# Patient Record
Sex: Female | Born: 1970 | Race: White | Hispanic: No | Smoking: Current every day smoker
Health system: Southern US, Community
[De-identification: ages and names within clinical notes are randomized; demographics above are authoritative.]

## PROBLEM LIST (undated history)

## (undated) DIAGNOSIS — M1990 Unspecified osteoarthritis, unspecified site: Secondary | ICD-10-CM

## (undated) DIAGNOSIS — M797 Fibromyalgia: Secondary | ICD-10-CM

## (undated) DIAGNOSIS — F419 Anxiety disorder, unspecified: Secondary | ICD-10-CM

## (undated) DIAGNOSIS — F32A Depression, unspecified: Secondary | ICD-10-CM

## (undated) DIAGNOSIS — R87629 Unspecified abnormal cytological findings in specimens from vagina: Secondary | ICD-10-CM

## (undated) DIAGNOSIS — J45909 Unspecified asthma, uncomplicated: Secondary | ICD-10-CM

## (undated) DIAGNOSIS — F329 Major depressive disorder, single episode, unspecified: Secondary | ICD-10-CM

## (undated) DIAGNOSIS — K219 Gastro-esophageal reflux disease without esophagitis: Secondary | ICD-10-CM

## (undated) DIAGNOSIS — Z972 Presence of dental prosthetic device (complete) (partial): Secondary | ICD-10-CM

## (undated) HISTORY — DX: Unspecified abnormal cytological findings in specimens from vagina: R87.629

## (undated) HISTORY — PX: CERVICAL CONE BIOPSY: SUR198

## (undated) HISTORY — PX: MOUTH SURGERY: SHX715

## (undated) HISTORY — PX: TUBAL LIGATION: SHX77

## (undated) HISTORY — PX: WISDOM TOOTH EXTRACTION: SHX21

---

## 2017-06-14 ENCOUNTER — Ambulatory Visit (INDEPENDENT_AMBULATORY_CARE_PROVIDER_SITE_OTHER): Payer: Medicare Other | Admitting: Obstetrics & Gynecology

## 2017-06-14 ENCOUNTER — Encounter: Payer: Self-pay | Admitting: Obstetrics & Gynecology

## 2017-06-14 ENCOUNTER — Other Ambulatory Visit (HOSPITAL_COMMUNITY)
Admission: RE | Admit: 2017-06-14 | Discharge: 2017-06-14 | Disposition: A | Discharge status: Home / Self Care | Payer: Medicare Other | Source: Ambulatory Visit | Attending: Obstetrics & Gynecology | Admitting: Obstetrics & Gynecology

## 2017-06-14 VITALS — BP 120/80 | HR 74 | Ht 66.0 in | Wt 230.0 lb

## 2017-06-14 DIAGNOSIS — Z0000 Encounter for general adult medical examination without abnormal findings: Secondary | ICD-10-CM | POA: Diagnosis present

## 2017-06-14 DIAGNOSIS — N941 Unspecified dyspareunia: Secondary | ICD-10-CM | POA: Diagnosis not present

## 2017-06-14 DIAGNOSIS — N921 Excessive and frequent menstruation with irregular cycle: Secondary | ICD-10-CM

## 2017-06-14 DIAGNOSIS — R102 Pelvic and perineal pain: Secondary | ICD-10-CM | POA: Diagnosis not present

## 2017-06-14 MED ORDER — MISOPROSTOL 200 MCG PO TABS: ORAL_TABLET | ORAL | 0 refills | Status: DC

## 2017-06-14 NOTE — Progress Notes (Signed)
   Subjective:    Patient ID: Megan Patel, female    DOB: 01-15-1971, 46 y.o.   MRN: [id number]  HPI    Review of Systems     Objective:   Physical Exam        Assessment & Plan:  Preventative care- pap with cotesting Menorrhagia- schedule gyn u/s, TSH, CBC Pretreat with cytotec before EMBX in 4 weeks

## 2017-06-14 NOTE — Progress Notes (Signed)
   Subjective:    Patient ID: Megan Patel, female    DOB: 14-Apr-1971, 46 y.o.   MRN: [id number]  HPI 46 yo DW P3 (44 to 44 yo kids, no grands yet) here today from [place], [place] for evaluation of "excessive bleeding". Periods normally monthy for 7-9 days but over the last 3 months she has had 2 periods per month. She also has pain in her pelvis. She also reports GSUI since 2004. She reports dyspareunia for "many years".  Review of Systems BTL for contraception in 2005. She has seen me in [facility] and I did a laparoscopy then. She has has 2 LEEPs at Dr. Luvenia Heller in [place], [place] Uses [facility] for her pain H/O narcotic use for her pain    Objective:   Physical Exam  Pleasant obese white female Breathing, conversing, and ambulating normally Abd- benign Cervix appears normal Bimanual exam [place] due to body habitus      Assessment & Plan:  Preventative care- pap smear DUB- gyn u/s, CBC, TSH Plan EMBX, pretreat with cytotec

## 2017-06-16 LAB — CYTOLOGY - PAP: HPV (WINDOPATH): DETECTED — AB

## 2017-06-17 LAB — CBC
HEMOGLOBIN: 14.1 g/dL (ref 11.1–15.9)
Hematocrit: 43.1 % (ref 34.0–46.6)
MCH: 27.9 pg (ref 26.6–33.0)
MCHC: 32.7 g/dL (ref 31.5–35.7)
MCV: 85 fL (ref 79–97)
PLATELETS: 336 10*3/uL (ref 150–379)
RBC: 5.06 x10E6/uL (ref 3.77–5.28)
RDW: 14.7 % (ref 12.3–15.4)
WBC: 8.7 10*3/uL (ref 3.4–10.8)

## 2017-06-17 LAB — TSH: TSH: 4.57 u[IU]/mL — AB (ref 0.450–4.500)

## 2017-06-17 LAB — VITAMIN D 1,25 DIHYDROXY
VITAMIN D3 1, 25 (OH): 37 pg/mL
Vitamin D 1, 25 (OH)2 Total: 38 pg/mL
Vitamin D2 1, 25 (OH)2: <10 pg/mL

## 2017-06-21 ENCOUNTER — Ambulatory Visit (HOSPITAL_COMMUNITY): Admission: RE | Admit: 2017-06-21 | Payer: Medicare Other | Source: Ambulatory Visit

## 2017-07-14 ENCOUNTER — Ambulatory Visit (INDEPENDENT_AMBULATORY_CARE_PROVIDER_SITE_OTHER): Payer: Medicare Other | Admitting: Obstetrics & Gynecology

## 2017-07-14 ENCOUNTER — Encounter: Payer: Self-pay | Admitting: Obstetrics & Gynecology

## 2017-07-14 ENCOUNTER — Telehealth: Payer: Self-pay

## 2017-07-14 ENCOUNTER — Encounter (HOSPITAL_COMMUNITY): Payer: Self-pay | Admitting: Emergency Medicine

## 2017-07-14 ENCOUNTER — Ambulatory Visit (HOSPITAL_COMMUNITY)
Admission: RE | Admit: 2017-07-14 | Discharge: 2017-07-14 | Disposition: A | Discharge status: Home / Self Care | Payer: Medicare Other | Source: Ambulatory Visit | Attending: Obstetrics & Gynecology | Admitting: Obstetrics & Gynecology

## 2017-07-14 ENCOUNTER — Other Ambulatory Visit (HOSPITAL_COMMUNITY)
Admission: RE | Admit: 2017-07-14 | Discharge: 2017-07-14 | Disposition: A | Discharge status: Home / Self Care | Payer: Medicare Other | Source: Ambulatory Visit | Attending: Obstetrics & Gynecology | Admitting: Obstetrics & Gynecology

## 2017-07-14 ENCOUNTER — Ambulatory Visit (HOSPITAL_COMMUNITY)
Admission: EM | Admit: 2017-07-14 | Discharge: 2017-07-14 | Disposition: A | Discharge status: Home / Self Care | Payer: Medicare Other | Attending: Family Medicine | Admitting: Family Medicine

## 2017-07-14 VITALS — BP 137/91 | HR 72 | Wt 226.1 lb

## 2017-07-14 DIAGNOSIS — Z3202 Encounter for pregnancy test, result negative: Secondary | ICD-10-CM

## 2017-07-14 DIAGNOSIS — S39012A Strain of muscle, fascia and tendon of lower back, initial encounter: Secondary | ICD-10-CM

## 2017-07-14 DIAGNOSIS — N870 Mild cervical dysplasia: Secondary | ICD-10-CM | POA: Insufficient documentation

## 2017-07-14 DIAGNOSIS — S8000XA Contusion of unspecified knee, initial encounter: Secondary | ICD-10-CM

## 2017-07-14 DIAGNOSIS — S46811A Strain of other muscles, fascia and tendons at shoulder and upper arm level, right arm, initial encounter: Secondary | ICD-10-CM

## 2017-07-14 DIAGNOSIS — N921 Excessive and frequent menstruation with irregular cycle: Secondary | ICD-10-CM

## 2017-07-14 DIAGNOSIS — R87612 Low grade squamous intraepithelial lesion on cytologic smear of cervix (LGSIL): Secondary | ICD-10-CM

## 2017-07-14 DIAGNOSIS — S29019A Strain of muscle and tendon of unspecified wall of thorax, initial encounter: Secondary | ICD-10-CM

## 2017-07-14 DIAGNOSIS — R946 Abnormal results of thyroid function studies: Secondary | ICD-10-CM

## 2017-07-14 DIAGNOSIS — R32 Unspecified urinary incontinence: Secondary | ICD-10-CM

## 2017-07-14 LAB — POCT PREGNANCY, URINE: PREG TEST UR: NEGATIVE

## 2017-07-14 MED ORDER — METHOCARBAMOL 500 MG PO TABS: 500.0000 mg | ORAL_TABLET | Freq: Two times a day (BID) | ORAL | 0 refills | Status: DC
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
## 2017-07-14 NOTE — Discharge Instructions (Signed)
Apply ice to the front of the knees for the next couple days. May apply heat to the muscles that are sore in your neck and back. So we perform stretches as demonstrated. Take the muscle relaxant has requested and directed. This will take a few days before the aches and pains to go away.

## 2017-07-14 NOTE — Telephone Encounter (Signed)
Pt needs to be contacted about Referral to [facility] @ [phone] scheduled for November 9th @ 1300.  Attempted to contact unable to LM due to phone keeps ringing.  Pt is aware to expect call with appt.

## 2017-07-14 NOTE — Addendum Note (Signed)
Addended by: Faythe Casa on: 07/14/2017 05:41 PM   Modules accepted: Orders
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
## 2017-07-15 LAB — T4, FREE: FREE T4: 1.05 ng/dL (ref 0.82–1.77)

## 2017-07-15 LAB — T3, FREE: T3, Free: 3.7 pg/mL (ref 2.0–4.4)

## 2017-07-21 ENCOUNTER — Encounter: Payer: Self-pay | Admitting: Obstetrics & Gynecology

## 2017-07-26 ENCOUNTER — Encounter: Payer: Self-pay | Admitting: *Deleted

## 2017-07-26 NOTE — Telephone Encounter (Signed)
Tried calling patient again. Still no answer, phone keeps ringing. Will send letter with appointment information.

## 2017-08-04 ENCOUNTER — Encounter (HOSPITAL_COMMUNITY): Payer: Self-pay

## 2017-08-04 ENCOUNTER — Encounter: Payer: Self-pay | Admitting: Obstetrics & Gynecology

## 2017-08-04 ENCOUNTER — Encounter (HOSPITAL_COMMUNITY): Payer: Self-pay | Admitting: *Deleted

## 2017-08-04 ENCOUNTER — Ambulatory Visit (INDEPENDENT_AMBULATORY_CARE_PROVIDER_SITE_OTHER): Payer: Medicare Other | Admitting: Obstetrics & Gynecology

## 2017-08-04 VITALS — BP 129/83 | HR 72 | Ht 66.0 in | Wt 230.0 lb

## 2017-08-04 DIAGNOSIS — N870 Mild cervical dysplasia: Secondary | ICD-10-CM

## 2017-08-04 DIAGNOSIS — N938 Other specified abnormal uterine and vaginal bleeding: Secondary | ICD-10-CM | POA: Diagnosis present

## 2017-08-04 MED ORDER — METRONIDAZOLE 500 MG PO TABS: ORAL_TABLET | ORAL | 0 refills | Status: DC

## 2017-08-04 NOTE — Progress Notes (Signed)
   Subjective:    Patient ID: Megan Patel, female    DOB: 20-Jul-1971, 46 y.o.   MRN: [id number]  HPI 46 yo DW P3 here for her results. 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

## 2017-08-16 NOTE — Anesthesia Preprocedure Evaluation (Addendum)
Anesthesia Evaluation  Patient identified by MRN, date of birth, ID band Patient awake    Reviewed: Allergy & Precautions, NPO status , Patient's Chart, lab work & pertinent test results  Airway Mallampati: II  TM Distance: >3 FB Neck ROM: Full    Dental  (+) Edentulous Upper, Edentulous Lower   Pulmonary asthma , Current Smoker,    Pulmonary exam normal breath sounds clear to auscultation       Cardiovascular negative cardio ROS Normal cardiovascular exam Rhythm:Regular Rate:Normal     Neuro/Psych Anxiety Depression negative neurological ROS     GI/Hepatic GERD  Controlled,(+)     substance abuse  marijuana use,   Endo/Other  Obesity  Renal/GU negative Renal ROS  negative genitourinary   Musculoskeletal  (+) Arthritis , Fibromyalgia -  Abdominal   Peds  Hematology negative hematology ROS (+)   Anesthesia Other Findings   Reproductive/Obstetrics                            Anesthesia Physical Anesthesia Plan  ASA: II  Anesthesia Plan: General   Post-op Pain Management:    Induction: Intravenous  PONV Risk Score and Plan: 3 and Ondansetron, Dexamethasone, Midazolam and Treatment may vary due to age or medical condition  Airway Management Planned: LMA  Additional Equipment: None  Intra-op Plan:   Post-operative Plan: Extubation in OR  Informed Consent: I have reviewed the patients History and Physical, chart, labs and discussed the procedure including the risks, benefits and alternatives for the proposed anesthesia with the patient or authorized representative who has indicated his/her understanding and acceptance.   Dental advisory given  Plan Discussed with: CRNA  Anesthesia Plan Comments:         Anesthesia Quick Evaluation

## 2017-08-17 ENCOUNTER — Ambulatory Visit (HOSPITAL_COMMUNITY): Payer: Medicare Other | Admitting: Anesthesiology

## 2017-08-17 ENCOUNTER — Encounter (HOSPITAL_COMMUNITY): Payer: Self-pay | Admitting: Emergency Medicine

## 2017-08-17 ENCOUNTER — Ambulatory Visit (HOSPITAL_COMMUNITY)
Admission: AD | Admit: 2017-08-17 | Discharge: 2017-08-17 | Disposition: A | Discharge status: Home / Self Care | Payer: Medicare Other | Source: Ambulatory Visit | Attending: Obstetrics & Gynecology | Admitting: Obstetrics & Gynecology

## 2017-08-17 ENCOUNTER — Encounter (HOSPITAL_COMMUNITY)
Admission: AD | Disposition: A | Discharge status: Home / Self Care | Payer: Self-pay | Source: Ambulatory Visit | Attending: Obstetrics & Gynecology

## 2017-08-17 DIAGNOSIS — F17210 Nicotine dependence, cigarettes, uncomplicated: Secondary | ICD-10-CM | POA: Diagnosis not present

## 2017-08-17 DIAGNOSIS — F419 Anxiety disorder, unspecified: Secondary | ICD-10-CM | POA: Insufficient documentation

## 2017-08-17 DIAGNOSIS — M797 Fibromyalgia: Secondary | ICD-10-CM | POA: Insufficient documentation

## 2017-08-17 DIAGNOSIS — F329 Major depressive disorder, single episode, unspecified: Secondary | ICD-10-CM | POA: Diagnosis not present

## 2017-08-17 DIAGNOSIS — N870 Mild cervical dysplasia: Secondary | ICD-10-CM | POA: Diagnosis not present

## 2017-08-17 DIAGNOSIS — K219 Gastro-esophageal reflux disease without esophagitis: Secondary | ICD-10-CM | POA: Diagnosis not present

## 2017-08-17 DIAGNOSIS — N938 Other specified abnormal uterine and vaginal bleeding: Secondary | ICD-10-CM | POA: Diagnosis not present

## 2017-08-17 DIAGNOSIS — J45909 Unspecified asthma, uncomplicated: Secondary | ICD-10-CM | POA: Diagnosis not present

## 2017-08-17 DIAGNOSIS — Z880 Allergy status to penicillin: Secondary | ICD-10-CM | POA: Diagnosis not present

## 2017-08-17 DIAGNOSIS — Z79899 Other long term (current) drug therapy: Secondary | ICD-10-CM | POA: Insufficient documentation

## 2017-08-17 HISTORY — DX: Unspecified osteoarthritis, unspecified site: M19.90

## 2017-08-17 HISTORY — DX: Presence of dental prosthetic device (complete) (partial): Z97.2

## 2017-08-17 HISTORY — DX: Unspecified asthma, uncomplicated: J45.909

## 2017-08-17 HISTORY — DX: Depression, unspecified: F32.A

## 2017-08-17 HISTORY — DX: Anxiety disorder, unspecified: F41.9

## 2017-08-17 HISTORY — DX: Major depressive disorder, single episode, unspecified: F32.9

## 2017-08-17 HISTORY — DX: Gastro-esophageal reflux disease without esophagitis: K21.9

## 2017-08-17 HISTORY — PX: CERVICAL CONIZATION W/BX: SHX1330

## 2017-08-17 HISTORY — PX: DILITATION & CURRETTAGE/HYSTROSCOPY WITH HYDROTHERMAL ABLATION: SHX5570

## 2017-08-17 HISTORY — DX: Fibromyalgia: M79.7

## 2017-08-17 LAB — CBC
HEMATOCRIT: 42.7 % (ref 36.0–46.0)
HEMOGLOBIN: 14.2 g/dL (ref 12.0–15.0)
MCH: 28.3 pg (ref 26.0–34.0)
MCHC: 33.3 g/dL (ref 30.0–36.0)
MCV: 85.2 fL (ref 78.0–100.0)
Platelets: 321 10*3/uL (ref 150–400)
RBC: 5.01 MIL/uL (ref 3.87–5.11)
RDW: 14.5 % (ref 11.5–15.5)
WBC: 10.4 10*3/uL (ref 4.0–10.5)

## 2017-08-17 LAB — PREGNANCY, URINE: Preg Test, Ur: NEGATIVE

## 2017-08-17 SURGERY — CONE BIOPSY, CERVIX
Anesthesia: General | Site: Vagina

## 2017-08-17 MED ORDER — SODIUM CHLORIDE 0.9 % IR SOLN
Status: DC | PRN
  Administered 2017-08-17: 3000 mL

## 2017-08-17 MED ORDER — FENTANYL CITRATE (PF) 100 MCG/2ML IJ SOLN
INTRAMUSCULAR | Status: AC
  Administered 2017-08-17: 50 ug via INTRAVENOUS
  Filled 2017-08-17: qty 2

## 2017-08-17 MED ORDER — HYDROMORPHONE HCL 1 MG/ML IJ SOLN
0.2500 mg | INTRAMUSCULAR | Status: DC | PRN
Start: 2017-08-17 — End: 2017-08-17
  Administered 2017-08-17 (×2): 0.5 mg via INTRAVENOUS

## 2017-08-17 MED ORDER — FENTANYL CITRATE (PF) 100 MCG/2ML IJ SOLN
INTRAMUSCULAR | Status: AC
  Filled 2017-08-17: qty 2

## 2017-08-17 MED ORDER — METRONIDAZOLE 500 MG PO TABS: 500.0000 mg | ORAL_TABLET | Freq: Two times a day (BID) | ORAL | 0 refills | Status: AC

## 2017-08-17 MED ORDER — PROMETHAZINE HCL 25 MG/ML IJ SOLN: 6.2500 mg | INTRAMUSCULAR | Status: DC | PRN

## 2017-08-17 MED ORDER — IODINE STRONG (LUGOLS) 5 % PO SOLN
ORAL | Status: DC | PRN
  Administered 2017-08-17: 0.2 mL

## 2017-08-17 MED ORDER — PROPOFOL 10 MG/ML IV BOLUS
INTRAVENOUS | Status: DC | PRN
  Administered 2017-08-17: 180 mg via INTRAVENOUS

## 2017-08-17 MED ORDER — LIDOCAINE HCL (CARDIAC) 20 MG/ML IV SOLN
INTRAVENOUS | Status: AC
  Filled 2017-08-17: qty 5

## 2017-08-17 MED ORDER — KETOROLAC TROMETHAMINE 30 MG/ML IJ SOLN
INTRAMUSCULAR | Status: DC | PRN
Start: 2017-08-17 — End: 2017-08-17
  Administered 2017-08-17: 30 mg via INTRAVENOUS

## 2017-08-17 MED ORDER — DEXAMETHASONE SODIUM PHOSPHATE 10 MG/ML IJ SOLN
INTRAMUSCULAR | Status: DC | PRN
  Administered 2017-08-17: 4 mg via INTRAVENOUS

## 2017-08-17 MED ORDER — IODINE STRONG (LUGOLS) 5 % PO SOLN
ORAL | Status: AC
  Filled 2017-08-17: qty 1

## 2017-08-17 MED ORDER — BUPIVACAINE HCL (PF) 0.5 % IJ SOLN
INTRAMUSCULAR | Status: AC
  Filled 2017-08-17: qty 30

## 2017-08-17 MED ORDER — ONDANSETRON HCL 4 MG/2ML IJ SOLN
INTRAMUSCULAR | Status: DC | PRN
  Administered 2017-08-17: 4 mg via INTRAVENOUS

## 2017-08-17 MED ORDER — ACETIC ACID 5 % SOLN
Status: AC
  Filled 2017-08-17: qty 500

## 2017-08-17 MED ORDER — MIDAZOLAM HCL 2 MG/2ML IJ SOLN
INTRAMUSCULAR | Status: DC | PRN
  Administered 2017-08-17: 1 mg via INTRAVENOUS

## 2017-08-17 MED ORDER — FENTANYL CITRATE (PF) 100 MCG/2ML IJ SOLN
INTRAMUSCULAR | Status: DC | PRN
  Administered 2017-08-17 (×2): 50 ug via INTRAVENOUS

## 2017-08-17 MED ORDER — LACTATED RINGERS IV SOLN
INTRAVENOUS | Status: DC
  Administered 2017-08-17 (×2): via INTRAVENOUS

## 2017-08-17 MED ORDER — FERRIC SUBSULFATE 259 MG/GM EX SOLN
CUTANEOUS | Status: AC
  Filled 2017-08-17: qty 8

## 2017-08-17 MED ORDER — DEXAMETHASONE SODIUM PHOSPHATE 4 MG/ML IJ SOLN
INTRAMUSCULAR | Status: AC
  Filled 2017-08-17: qty 1

## 2017-08-17 MED ORDER — SCOPOLAMINE 1 MG/3DAYS TD PT72
MEDICATED_PATCH | TRANSDERMAL | Status: AC
  Administered 2017-08-17: 1.5 mg via TRANSDERMAL
  Filled 2017-08-17: qty 1

## 2017-08-17 MED ORDER — SCOPOLAMINE 1 MG/3DAYS TD PT72
1.0000 | MEDICATED_PATCH | Freq: Once | TRANSDERMAL | Status: DC
  Administered 2017-08-17: 1.5 mg via TRANSDERMAL

## 2017-08-17 MED ORDER — OXYCODONE-ACETAMINOPHEN 5-325 MG PO TABS
1.0000 | ORAL_TABLET | ORAL | Status: DC | PRN
  Administered 2017-08-17: 1 via ORAL

## 2017-08-17 MED ORDER — FENTANYL CITRATE (PF) 100 MCG/2ML IJ SOLN
25.0000 ug | INTRAMUSCULAR | Status: DC | PRN
  Administered 2017-08-17 (×3): 50 ug via INTRAVENOUS

## 2017-08-17 MED ORDER — ONDANSETRON HCL 4 MG/2ML IJ SOLN
INTRAMUSCULAR | Status: AC
  Filled 2017-08-17: qty 2

## 2017-08-17 MED ORDER — OXYCODONE-ACETAMINOPHEN 5-325 MG PO TABS
ORAL_TABLET | ORAL | Status: AC
  Filled 2017-08-17: qty 1

## 2017-08-17 MED ORDER — BUPIVACAINE HCL 0.5 % IJ SOLN
INTRAMUSCULAR | Status: DC | PRN
  Administered 2017-08-17: 30 mL

## 2017-08-17 MED ORDER — HYDROMORPHONE HCL 1 MG/ML IJ SOLN
INTRAMUSCULAR | Status: AC
  Filled 2017-08-17: qty 0.5

## 2017-08-17 MED ORDER — PROPOFOL 10 MG/ML IV BOLUS
INTRAVENOUS | Status: AC
  Filled 2017-08-17: qty 20

## 2017-08-17 MED ORDER — MIDAZOLAM HCL 2 MG/2ML IJ SOLN
INTRAMUSCULAR | Status: AC
  Filled 2017-08-17: qty 2

## 2017-08-17 MED ORDER — HYDROMORPHONE HCL 1 MG/ML IJ SOLN
INTRAMUSCULAR | Status: AC
  Administered 2017-08-17: 0.5 mg via INTRAVENOUS
  Filled 2017-08-17: qty 0.5

## 2017-08-17 MED ORDER — OXYCODONE-ACETAMINOPHEN 5-325 MG PO TABS: 1.0000 | ORAL_TABLET | Freq: Four times a day (QID) | ORAL | 0 refills | Status: AC | PRN

## 2017-08-17 MED ORDER — LIDOCAINE HCL (CARDIAC) 20 MG/ML IV SOLN
INTRAVENOUS | Status: DC | PRN
  Administered 2017-08-17: 30 mg via INTRAVENOUS
  Administered 2017-08-17: 70 mg via INTRAVENOUS

## 2017-08-17 SURGICAL SUPPLY — 29 items
BLADE SURG 11 STRL SS (BLADE) ×5 IMPLANT
CANISTER SUCT 3000ML PPV (MISCELLANEOUS) ×5 IMPLANT
CLOTH BEACON ORANGE TIMEOUT ST (SAFETY) IMPLANT
CONTAINER PREFILL 10% NBF 60ML (FORM) ×10 IMPLANT
DILATOR CANAL MILEX (MISCELLANEOUS) IMPLANT
ELECT REM PT RETURN 9FT ADLT (ELECTROSURGICAL) ×5
ELECTRODE REM PT RTRN 9FT ADLT (ELECTROSURGICAL) ×3 IMPLANT
GLOVE BIO SURGEON STRL SZ 6.5 (GLOVE) IMPLANT
GLOVE BIO SURGEONS STRL SZ 6.5 (GLOVE)
GLOVE BIOGEL PI IND STRL 6 (GLOVE) ×3 IMPLANT
GLOVE BIOGEL PI IND STRL 7.0 (GLOVE) ×3 IMPLANT
GLOVE BIOGEL PI INDICATOR 6 (GLOVE) ×2
GLOVE BIOGEL PI INDICATOR 7.0 (GLOVE) ×2
GOWN STRL REUS W/TWL LRG LVL3 (GOWN DISPOSABLE) ×10 IMPLANT
NEEDLE SPNL 18GX3.5 QUINCKE PK (NEEDLE) ×5 IMPLANT
PACK VAGINAL MINOR WOMEN LF (CUSTOM PROCEDURE TRAY) ×5 IMPLANT
PAD OB MATERNITY 4.3X12.25 (PERSONAL CARE ITEMS) ×5 IMPLANT
PENCIL BUTTON HOLSTER BLD 10FT (ELECTRODE) ×5 IMPLANT
SCOPETTES 8  STERILE (MISCELLANEOUS) ×2
SCOPETTES 8 STERILE (MISCELLANEOUS) ×3 IMPLANT
SET GENESYS HTA PROCERVA (MISCELLANEOUS) ×5 IMPLANT
SPONGE SURGIFOAM ABS GEL 12-7 (HEMOSTASIS) IMPLANT
SUT VIC AB 0 CT1 27 (SUTURE)
SUT VIC AB 0 CT1 27XBRD ANBCTR (SUTURE) IMPLANT
SYR 30ML LL (SYRINGE) ×5 IMPLANT
TOWEL OR 17X24 6PK STRL BLUE (TOWEL DISPOSABLE) ×10 IMPLANT
TUBING NON-CON 1/4 X 20 CONN (TUBING) ×4 IMPLANT
TUBING NON-CON 1/4 X 20' CONN (TUBING) ×1
YANKAUER SUCT BULB TIP NO VENT (SUCTIONS) ×5 IMPLANT

## 2017-08-17 NOTE — H&P (Signed)
Megan Patel is an 46 yo DW P3 (23 to 44 yo kids, no grands yet) [place] treatment of "excessive bleeding". Periods normally monthy for 7-9 days but over the last 3 months she has had 2 periods per month. Her EMBX was normal. I will plan to do a d&c, endometrial ablation. She had an abnormal pap. Her ECC was + for CIN1  *   Menstrual History: Menarche age: [age] Patient's last menstrual period was 08/04/2017.    Past Medical History:  Diagnosis Date  . Anxiety   . Arthritis   . Asthma    rarely uses inhaler  . Depression   . Fibromyalgia   . GERD (gastroesophageal reflux disease)    occasional diet controlled - no meds  . SVD (spontaneous vaginal delivery)    x 3  . Vaginal Pap smear, abnormal   . Wears dentures    upper and lower    Past Surgical History:  Procedure Laterality Date  . CERVICAL CONE BIOPSY    . MOUTH SURGERY     teeth pulled  . TUBAL LIGATION    . WISDOM TOOTH EXTRACTION      History reviewed. No pertinent family history.  Social History:  reports that she has been smoking Cigarettes.  She has a 32.00 pack-year smoking history. She has never used smokeless tobacco. She reports that she uses drugs, including Marijuana. She reports that she does not drink alcohol.  Allergies:  Allergies  Allergen Reactions  . Penicillins Hives, Shortness Of Breath and Swelling    Has patient had a PCN reaction causing immediate rash, facial/tongue/throat swelling, SOB or lightheadedness with hypotension: Yes Has patient had a PCN reaction causing severe rash involving mucus membranes or skin necrosis: Yes Has patient had a PCN reaction that required hospitalization: No Has patient had a PCN reaction occurring within the last 10 years: No If all of the above answers are "NO", then may proceed with Cephalosporin use.   . Latex Itching    redness  . Naproxen Nausea And Vomiting    Flushed     Prescriptions Prior to Admission  Medication Sig  Dispense Refill Last Dose  . Albuterol Sulfate 108 (90 Base) MCG/ACT AEPB Inhale 2 puffs into the lungs every 6 (six) hours as needed (shortness of breath).    08/17/2017 at Unknown time  . diphenhydrAMINE (BENADRYL) 25 MG tablet Take 25 mg by mouth 2 (two) times daily as needed for allergies.   Past Week at Unknown time  . DULoxetine (CYMBALTA) 60 MG capsule Take 60 mg by mouth daily.   08/16/2017 at Unknown time  . gabapentin (NEURONTIN) 100 MG capsule Take 100 mg by mouth 3 (three) times daily.   3 Past Week at Unknown time  . ibuprofen (ADVIL,MOTRIN) 200 MG tablet Take 800 mg by mouth 2 (two) times daily as needed for moderate pain.   Past Week at Unknown time  . methocarbamol (ROBAXIN) 500 MG tablet Take 1 tablet (500 mg total) by mouth 2 (two) times daily. (Patient not taking: Reported on 08/04/2017) 20 tablet 0 Completed Course at Unknown time  . metroNIDAZOLE (FLAGYL) 500 MG tablet Take 2 grams orally once 4 tablet 0     ROS  Blood pressure 112/77, pulse 75, temperature 98 F (36.7 C), temperature source Oral, resp. rate 16, height 5\' 6"  (1.676 m), weight 104.3 kg (230 lb), last menstrual period 08/04/2017, SpO2 100 %. Physical Exam  Heart- rrr Lungs- CTAB Abd- benign  Results for orders placed or performed during the hospital encounter of 08/17/17 (from the past 24 hour(s))  Pregnancy, urine     Status: None   Collection Time: 08/17/17  6:00 AM  Result Value Ref Range   Preg Test, Ur NEGATIVE NEGATIVE  CBC     Status: None   Collection Time: 08/17/17  6:35 AM  Result Value Ref Range   WBC 10.4 4.0 - 10.5 K/uL   RBC 5.01 3.87 - 5.11 MIL/uL   Hemoglobin 14.2 12.0 - 15.0 g/dL   HCT 91.4 78.2 - 95.6 %   MCV 85.2 78.0 - 100.0 fL   MCH 28.3 26.0 - 34.0 pg   MCHC 33.3 30.0 - 36.0 g/dL   RDW 21.3 08.6 - 57.8 %   Platelets 321 150 - 400 K/uL    No results found.  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  She understands the risks of surgery,  including, but not to infection, bleeding, DVTs, damage to bowel, bladder, ureters. She wishes to proceed.      Allie Bossier 08/17/2017, 7:13 AM

## 2017-08-17 NOTE — Transfer of Care (Signed)
Immediate Anesthesia Transfer of Care Note  Patient: Megan Patel  Procedure(s) Performed: CONIZATION CERVIX WITH BIOPSY - COLD KNIFE (N/A Vagina ) DILATATION & CURETTAGE/HYSTEROSCOPY WITH HYDROTHERMAL ABLATION  Patient Location: PACU  Anesthesia Type:General  Level of Consciousness: awake, alert , oriented and patient cooperative  Airway & Oxygen Therapy: Patient Spontanous Breathing and Patient connected to nasal cannula oxygen  Post-op Assessment: Report given to RN and Post -op Vital signs reviewed and stable  Post vital signs: Reviewed and stable  Last Vitals:  Vitals:   08/17/17 0628  BP: 112/77  Pulse: 75  Resp: 16  Temp: 36.7 C  SpO2: 100%    Last Pain:  Vitals:   08/17/17 0628  TempSrc: Oral  PainSc: 4       Patients Stated Pain Goal: 4 (08/17/17 [id number])  Complications: No apparent anesthesia complications

## 2017-08-17 NOTE — Anesthesia Procedure Notes (Signed)
Procedure Name: LMA Insertion Date/Time: 08/17/2017 7:32 AM Performed by: Shanon PayorGREGORY, Heylee Tant M Pre-anesthesia Checklist: Patient identified, Emergency Drugs available, Suction available and Patient being monitored Patient Re-evaluated:Patient Re-evaluated prior to induction Oxygen Delivery Method: Circle system utilized and Simple face mask Preoxygenation: Pre-oxygenation with 100% oxygen Induction Type: IV induction Ventilation: Mask ventilation without difficulty LMA: LMA inserted LMA Size: 4.0 Grade View: Grade II Tube type: Oral Number of attempts: 1 Placement Confirmation: positive ETCO2 and CO2 detector Dental Injury: Teeth and Oropharynx as per pre-operative assessment

## 2017-08-17 NOTE — Discharge Instructions (Signed)
Dilation and Curettage or Vacuum Curettage, Care After This sheet gives you information about how to care for yourself after your procedure. Your health care provider may also give you more specific instructions. If you have problems or questions, contact your health care provider. What can I expect after the procedure? After your procedure, it is common to have:  Mild pain or cramping.  Some vaginal bleeding or spotting.  These may last for up to 2 weeks after your procedure. Follow these instructions at home: Activity   Do not drive or use heavy machinery while taking prescription pain medicine.  Avoid driving for the first 24 hours after your procedure.  Take frequent, short walks, followed by rest periods, throughout the day. Ask your health care provider what activities are safe for you. After 1-2 days, you may be able to return to your normal activities.  Do not lift anything heavier than 10 lb (4.5 kg) until your health care provider approves.  For at least 2 weeks, or as long as told by your health care provider, do not: ? Douche. ? Use tampons. ? Have sexual intercourse. General instructions   Take over-the-counter and prescription medicines only as told by your health care provider. This is especially important if you take blood thinning medicine.  Do not take baths, swim, or use a hot tub until your health care provider approves. Take showers instead of baths.  Wear compression stockings as told by your health care provider. These stockings help to prevent blood clots and reduce swelling in your legs.  It is your responsibility to get the results of your procedure. Ask your health care provider, or the department performing the procedure, when your results will be ready.  Keep all follow-up visits as told by your health care provider. This is important. Contact a health care provider if:  You have severe cramps that get worse or that do not get better with  medicine.  You have severe abdominal pain.  You cannot drink fluids without vomiting.  You develop pain in a different area of your pelvis.  You have bad-smelling vaginal discharge.  You have a rash. Get help right away if:  You have vaginal bleeding that soaks more than one sanitary pad in 1 hour, for 2 hours in a row.  You pass large blood clots from your vagina.  You have a fever that is above 100.39F (38.0C).  Your abdomen feels very tender or hard.  You have chest pain.  You have shortness of breath.  You cough up blood.  You feel dizzy or light-headed.  You faint.  You have pain in your neck or shoulder area. This information is not intended to replace advice given to you by your health care provider. Make sure you discuss any questions you have with your health care provider. Document Released: 10/09/2000 Document Revised: 06/10/2016 Document Reviewed: 05/14/2016 Elsevier Interactive Patient Education  2018 [employer] Cervical Conization, Care After This sheet gives you information about how to care for yourself after your procedure. Your doctor may also give you more specific instructions. If you have problems or questions, contact your doctor. Follow these instructions at home: Medicines  Take over-the-counter and prescription medicines only as told by your doctor.  Do not take aspirin until your doctor says it is okay.  If you take pain medicine: ? You may have constipation. To help treat this, your doctor may tell you to:  Drink enough fluid to keep your pee (urine) clear or  pale yellow.  Take medicines.  Eat foods that are high in fiber. These include fresh fruits and vegetables, whole grains, bran, and beans.  Limit foods that are high in fat and sugar. These include fried foods and sweet foods. ? Do not drive or use heavy machines. General instructions  You can eat your usual diet unless your doctor tells you not to do so.  Take showers  for the first week. Do not take baths, swim, or use hot tubs until your doctor says it is okay.  Do not douche, use tampons, or have sex until your doctor says it is okay.  For 7-14 days after your procedure, avoid: ? Being very active. ? Exercising. ? Heavy lifting.  Keep all follow-up visits as told by your doctor. This is important. Contact a doctor if:  You have a rash.  You are dizzy or lightheaded.  You feel sick to your stomach (nauseous).  You throw up (vomit).  You have fluid from your vagina (vaginal discharge) that smells bad. Get help right away if:  There are blood clots coming from your vagina.  You have more bleeding than you would have in a normal period. For example, you soak a pad in less than 1 hour.  You have a fever.  You have more and more cramps.  You pass out (faint).  You have pain when peeing.  Your have a lot of pain.  Your pain gets worse.  Your pain does not get better when you take your medicine.  You have blood in your pee.  You throw up (vomit). Summary  After your procedure, take over-the-counter and prescription medicines only as told by your doctor.  Do not douche, use tampons, or have sex until your doctor says it is okay.  For about 7-14 days after your procedure, try not to exercise or lift heavy objects.  Get help right away if you have new symptoms, or if your symptoms become worse. This information is not intended to replace advice given to you by your health care provider. Make sure you discuss any questions you have with your health care provider. Document Released: 07/21/2008 Document Revised: 10/14/2016 Document Reviewed: 10/14/2016 Elsevier Interactive Patient Education  2017 [employer]

## 2017-08-17 NOTE — Anesthesia Postprocedure Evaluation (Signed)
Anesthesia Post Note  Patient: Megan Patel  Procedure(s) Performed: CONIZATION CERVIX WITH BIOPSY - COLD KNIFE (N/A Vagina ) DILATATION & CURETTAGE/HYSTEROSCOPY WITH HYDROTHERMAL ABLATION     Patient location during evaluation: PACU Anesthesia Type: General Level of consciousness: awake and alert Pain management: pain level controlled Vital Signs Assessment: post-procedure vital signs reviewed and stable Respiratory status: spontaneous breathing, nonlabored ventilation and respiratory function stable Cardiovascular status: blood pressure returned to baseline and stable Postop Assessment: no apparent nausea or vomiting Anesthetic complications: no    Last Vitals:  Vitals:   08/17/17 1000 08/17/17 1036  BP: (!) 144/91 133/87  Pulse: 78 82  Resp: 11 14  Temp:    SpO2: 98% 97%    Last Pain:  Vitals:   08/17/17 1036  TempSrc:   PainSc: 4    Pain Goal: Patients Stated Pain Goal: 4 (08/17/17 0628)               Beryle Lathehomas E Yuritza Paulhus

## 2017-08-17 NOTE — Op Note (Signed)
08/17/2017  8:19 AM  PATIENT:  Megan Patel  46 y.o. female  PRE-OPERATIVE DIAGNOSIS:  CIN1 of ECC, DUB   POST-OPERATIVE DIAGNOSIS:  CIN1 of ECC, DUB   PROCEDURE:  Procedure(s): CONIZATION CERVIX WITH BIOPSY - COLD KNIFE (N/A) DILATATION & CURETTAGE/HYSTEROSCOPY WITH HYDROTHERMAL ABLATION  SURGEON:  Surgeon(s) and Role:    * Deronte Solis C, MD - Primary   ANESTHESIA:   local and general  EBL:  3 mL   BLOOD ADMINISTERED:none  DRAINS: none   LOCAL MEDICATIONS USED:  MARCAINE     SPECIMEN:  Source of Specimen:  uterine curettings, endocervical curettage, and cone biopsy of cervix  DISPOSITION OF SPECIMEN:  PATHOLOGY  COUNTS:  YES  TOURNIQUET:  * No tourniquets in log *  DICTATION: .Dragon Dictation  PLAN OF CARE: Discharge to home after PACU  PATIENT DISPOSITION:  PACU - hemodynamically stable.   Delay start of Pharmacological VTE agent (>24hrs) due to surgical blood loss or risk of bleeding: yes  A speculum was placed and a single-tooth tenaculum was used to grasp the anterior lip of her cervix. A total of 30 mL of 0.5% Marcaine was used to perform a paracervical block. Her uterus sounded to 9 cm. Her cervix was carefully and slowly dilated to accommodate a small curette. A curettage was done in all quadrants and the fundus of the uterus. A large amount of polypoid-type  tissue was obtained. A gritty sensation was appreciated throughout. I then placed the HTA device. Hysteroscopy showed a shaggy endometrium. The device passed its test and ran for 10 minutes. After the cool down period, it was removed. I then proceeded with the cone biopsy. I placed Lugol's solution on the cervix and saw no non-staining area. I used a scalpel to remove a cone-shaped portion of cervix. I then obtained an ECC. I cauterized the cone bed. Hemostasis was noted. I did a purse-string closure of the cervix with 2-0 vicryl suture. There was no bleeding noted at the end of the case. She was taken to the  recovery room after being extubated. She tolerated the procedure well.

## 2017-08-18 ENCOUNTER — Encounter (HOSPITAL_COMMUNITY): Payer: Self-pay | Admitting: Obstetrics & Gynecology

## 2017-08-20 ENCOUNTER — Other Ambulatory Visit (HOSPITAL_COMMUNITY): Payer: Self-pay | Admitting: Obstetrics & Gynecology

## 2017-08-27 ENCOUNTER — Telehealth: Payer: Self-pay

## 2017-08-27 NOTE — Telephone Encounter (Signed)
Contacted pt in regards to refill on Percocet. Pt states that she had requested that a couple days after her surgery she was not requesting now.  She stated that her pain is doing well with otc ibuprofen 200 mg tablets that she take 600 mg.  Pt states that she does not have any need at this time.

## 2017-10-12 ENCOUNTER — Encounter: Payer: Self-pay | Admitting: Obstetrics & Gynecology

## 2017-10-12 ENCOUNTER — Ambulatory Visit: Payer: Medicare Other | Admitting: Obstetrics & Gynecology

## 2017-10-12 VITALS — BP 116/74 | HR 70 | Ht 66.0 in | Wt 241.9 lb

## 2017-10-12 DIAGNOSIS — Z9889 Other specified postprocedural states: Secondary | ICD-10-CM

## 2017-10-12 LAB — POCT URINALYSIS DIP (DEVICE)
BILIRUBIN URINE: NEGATIVE
GLUCOSE, UA: NEGATIVE mg/dL
Hgb urine dipstick: NEGATIVE
KETONES UR: NEGATIVE mg/dL
Leukocytes, UA: NEGATIVE
Nitrite: NEGATIVE
PROTEIN: NEGATIVE mg/dL
SPECIFIC GRAVITY, URINE: 1.02 (ref 1.005–1.030)
Urobilinogen, UA: 0.2 mg/dL (ref 0.0–1.0)
pH: 5.5 (ref 5.0–8.0)

## 2017-10-12 NOTE — Progress Notes (Signed)
   Subjective:    Patient ID: Megan Patel, female    DOB: Oct 23, 1971, 46 y.o.   MRN: [id number]  HPI 46 yo divorced White P3 here for a post op visit. She had a CKC and ECC for + ECC and a d&c and HTA for DUB on 08-17-17. She has some spotting for a couple of weeks after surgery, but no bleeding since then. She has had no postop problems.   Review of Systems     Objective:   Physical Exam Pathology:  Diagnosis 1. Endometrium, curettage SECRETARY TYPE ENDOMETRIUM NEGATIVE FOR ATYPIA OR MALIGNANCY 2. Endocervix, curettage SCANT BENIGN ENDOCERVICAL TISSUE 3. 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

## 2018-07-04 IMAGING — US US TRANSVAGINAL NON-OB
1 series · 15 of 25 positions shown · non-contrast
Comparison: None

CLINICAL DATA: Menorrhagia

EXAM:
TRANSABDOMINAL AND TRANSVAGINAL ULTRASOUND OF PELVIS
TECHNIQUE: Both transabdominal and transvaginal ultrasound examinations of the
pelvis were performed. Transabdominal technique was performed for
global imaging of the pelvis including uterus, ovaries, adnexal
regions, and pelvic cul-de-sac. It was necessary to proceed with
endovaginal exam following the transabdominal exam to visualize the
uterus, endometrium, ovaries and adnexa .

[Series 1: us transvaginal non-ob · 15 of 63 slices shown]
[im 1/63]
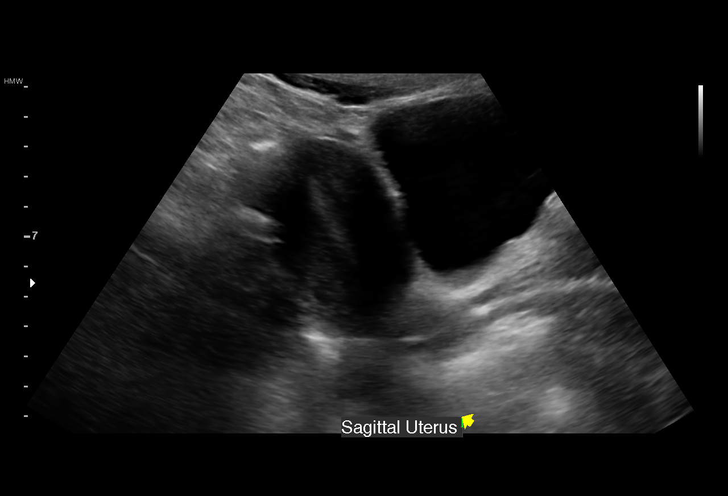
[im 6/63]
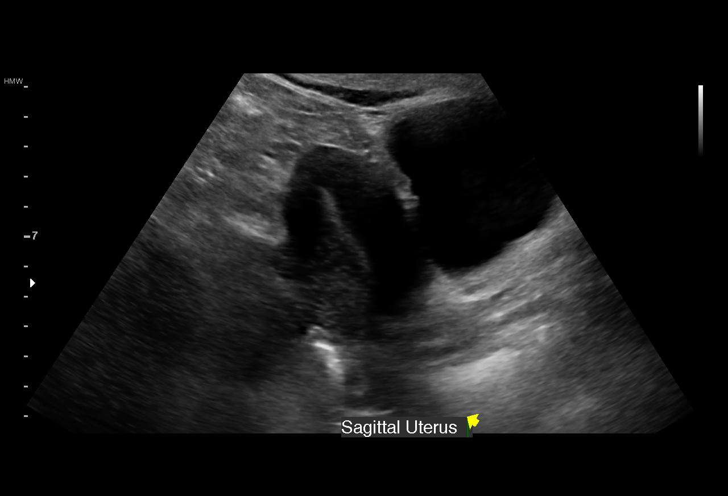
[im 11/63]
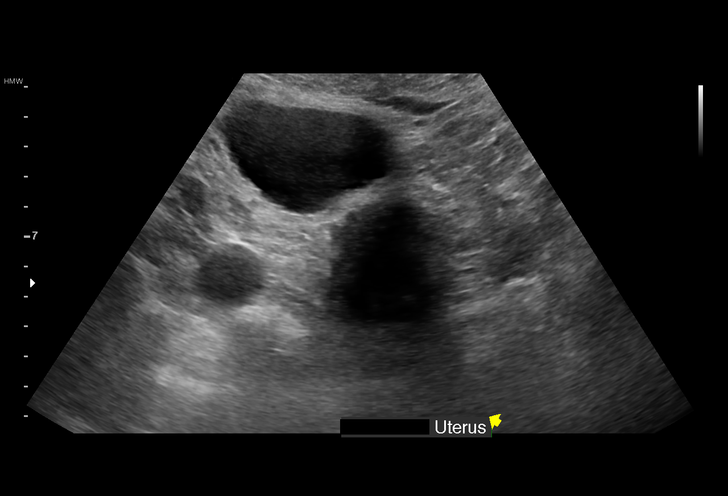
[im 13/63]
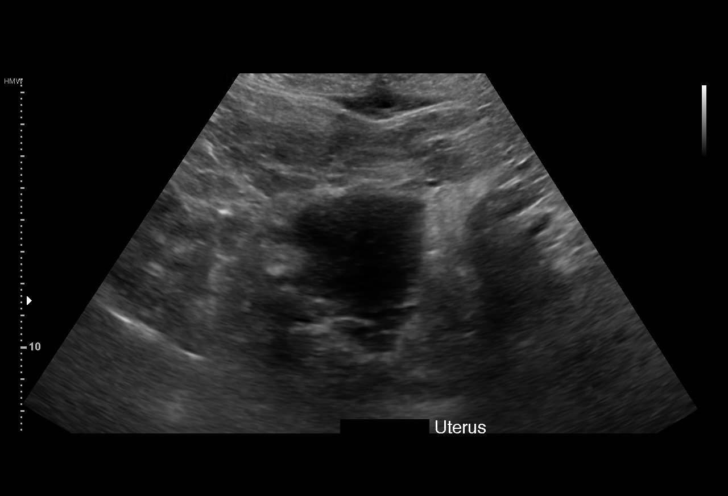
[im 19/63]
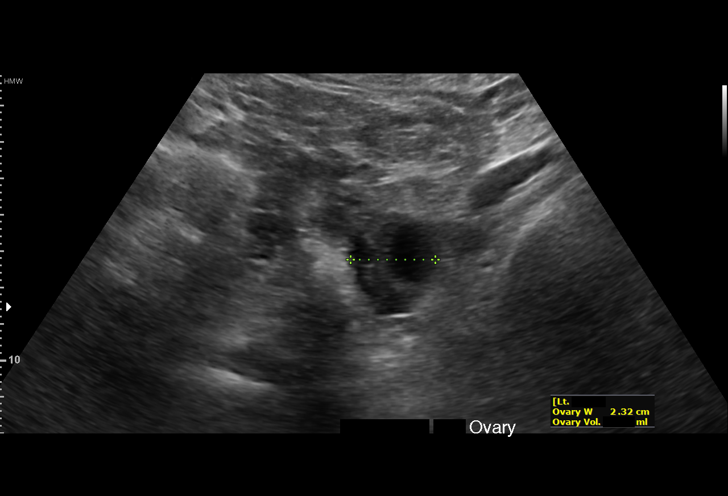
[im 24/63]
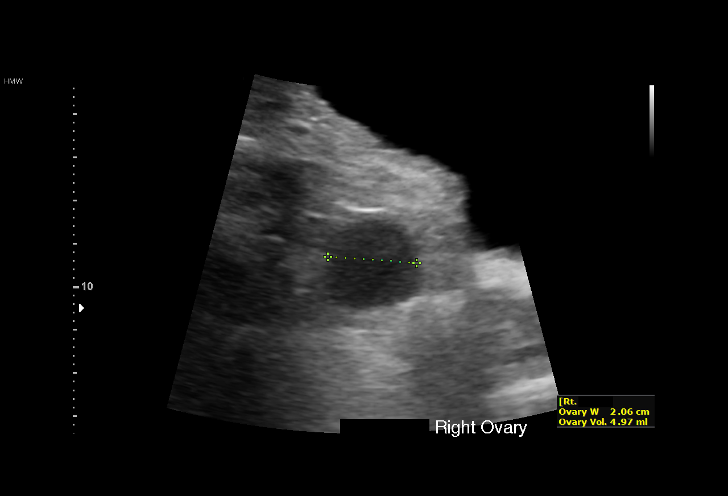
[im 26/63]
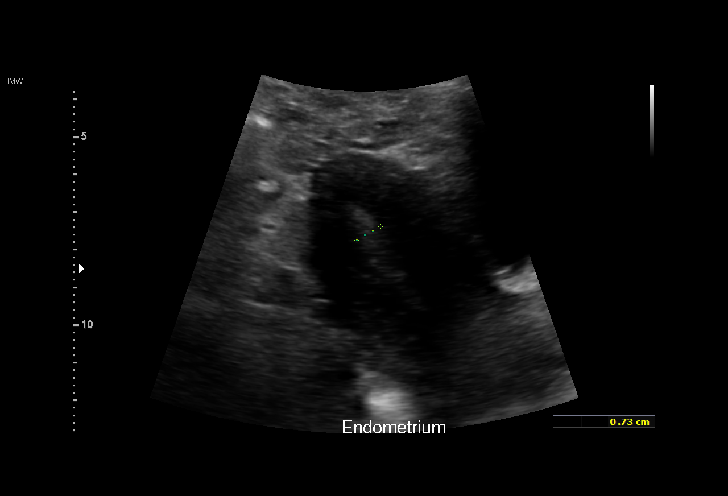
[im 32/63]
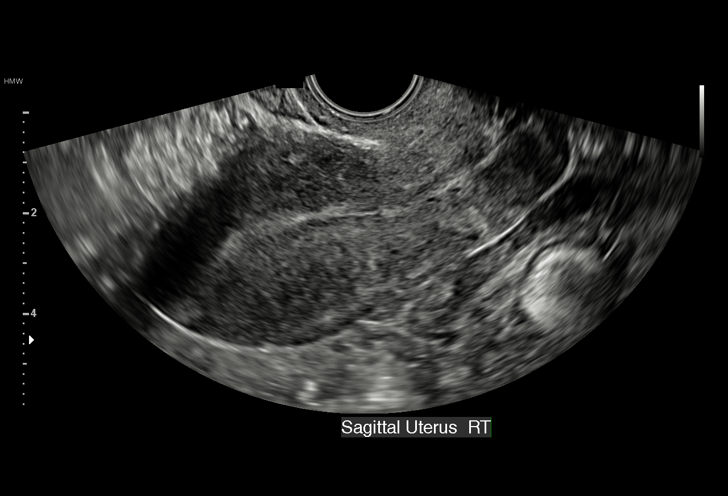
[im 37/63]
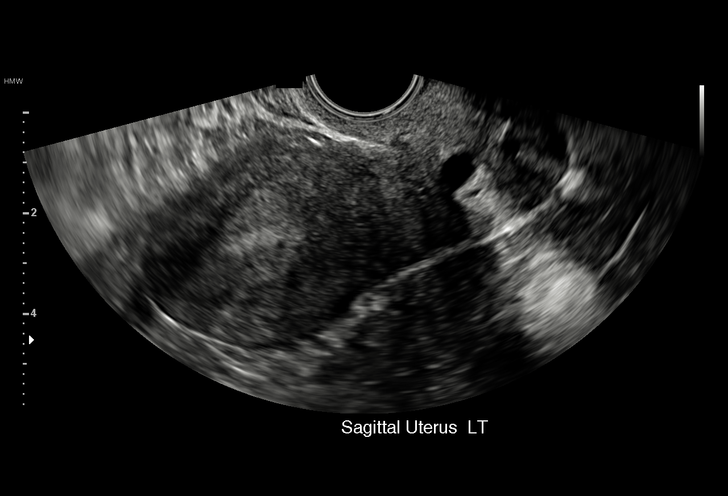
[im 39/63]
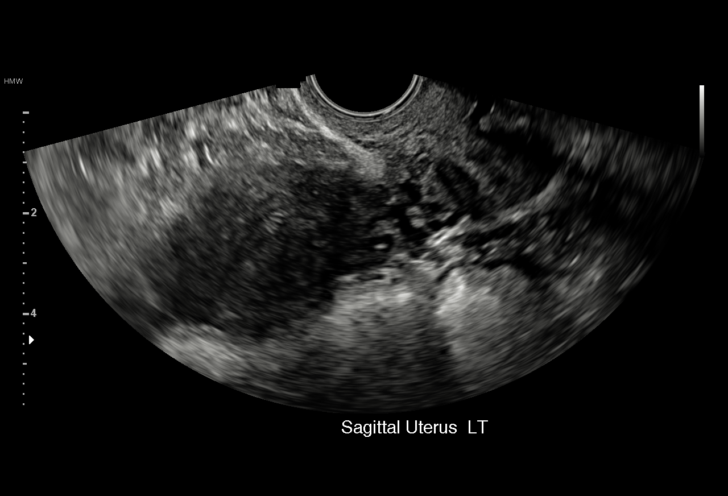
[im 44/63]
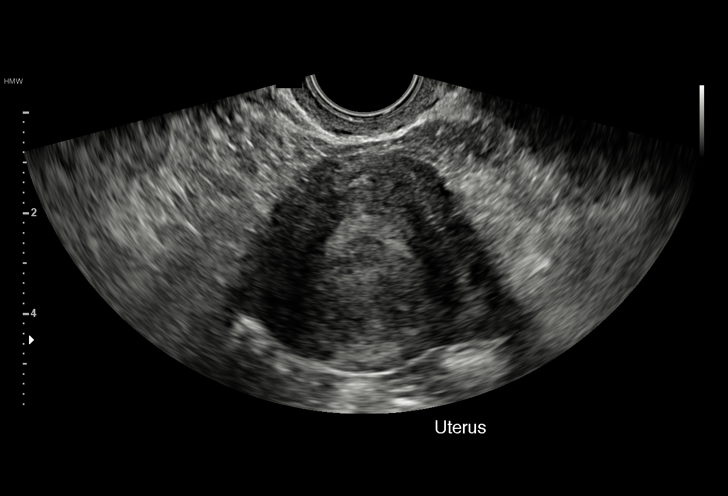
[im 50/63]
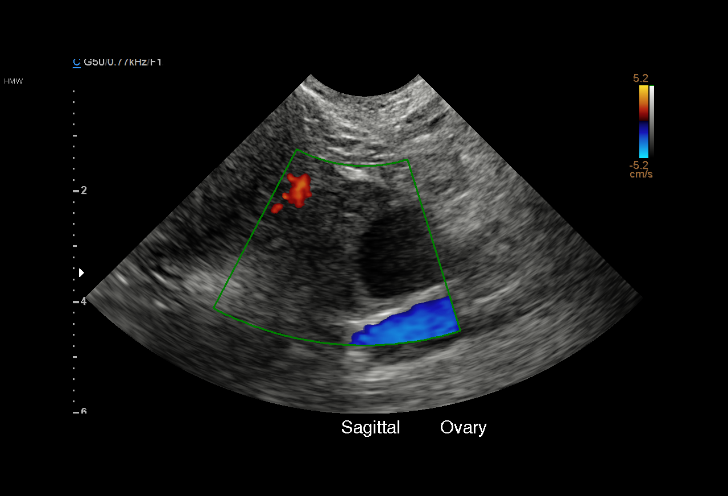
[im 52/63]
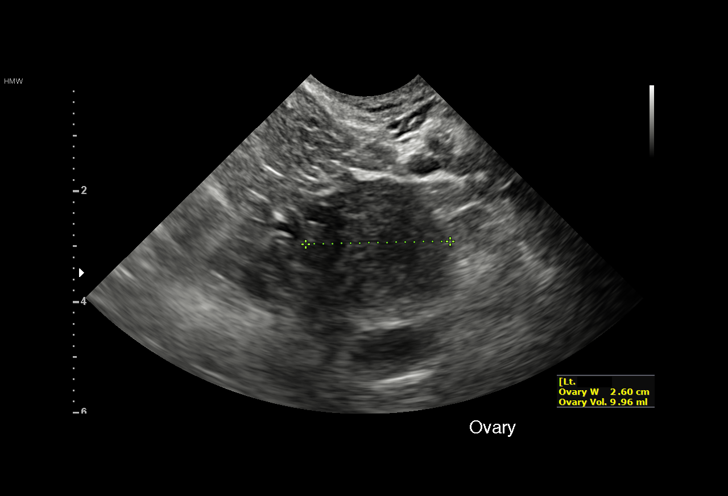
[im 57/63]
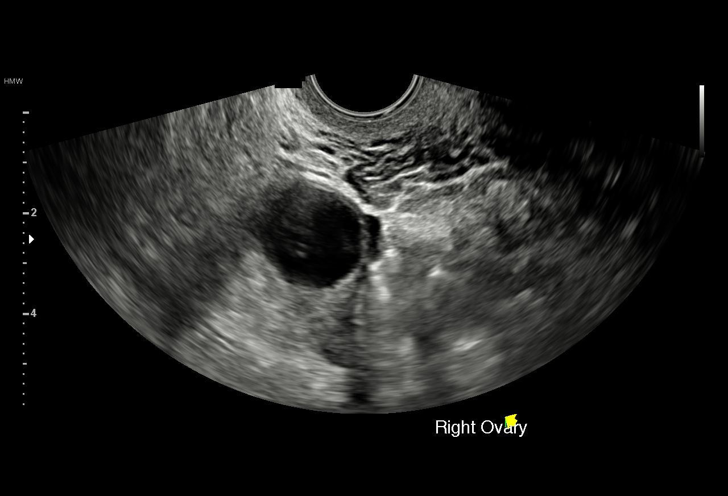
[im 63/63]
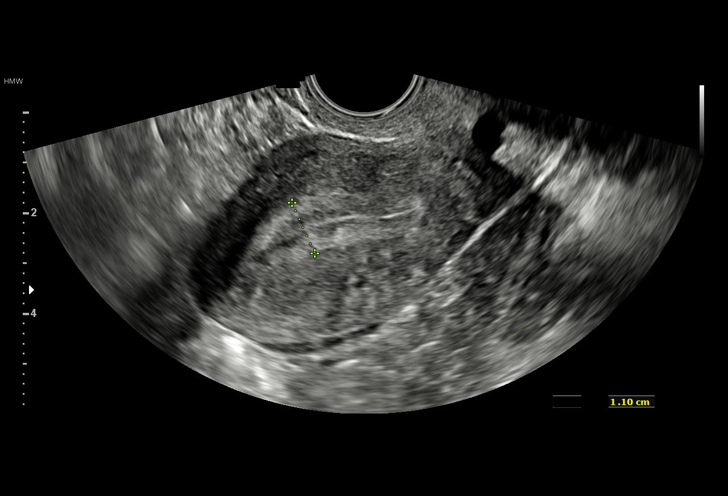

[15 of 25 positions shown; findings below may reference images not displayed]

FINDINGS: Uterus

Measurements: 9.1 x 4.2 x 4.0 cm. No fibroids or other mass
visualized.

Endometrium

Thickness: 11 mm in thickness.  No focal abnormality visualized.

Right ovary

Measurements: 3.2 x 2.5 x 2.3 cm. Normal appearance/no adnexal mass.
Dominant follicle noted.

Left ovary

Measurements: 2.9 x 2.6 x 2.6 cm. Normal appearance/no adnexal mass.

Other findings

No abnormal free fluid.
IMPRESSION: Unremarkable pelvic ultrasound. If bleeding remains unresponsive to
hormonal or medical therapy, sonohysterogram should be considered
for focal lesion work-up. (Ref: Radiological Reasoning: Algorithmic
Workup of Abnormal Vaginal Bleeding with Endovaginal Sonography and
Sonohysterography. AJR [id number]; [id number])
